# Patient Record
Sex: Male | Born: 2000 | Race: Black or African American | Hispanic: No | Marital: Single
Health system: Southern US, Community
[De-identification: ages and names within clinical notes are randomized; demographics above are authoritative.]

---

## 2020-07-19 ENCOUNTER — Other Ambulatory Visit: Payer: Self-pay

## 2020-07-19 ENCOUNTER — Emergency Department (HOSPITAL_COMMUNITY)
Admission: EM | Admit: 2020-07-19 | Discharge: 2020-07-20 | Discharge status: Against Medical Advice | Payer: Medicaid Other | Attending: Emergency Medicine | Admitting: Emergency Medicine

## 2020-07-19 ENCOUNTER — Emergency Department (HOSPITAL_COMMUNITY): Payer: Medicaid Other

## 2020-07-19 DIAGNOSIS — Z5321 Procedure and treatment not carried out due to patient leaving prior to being seen by health care provider: Secondary | ICD-10-CM | POA: Insufficient documentation

## 2020-07-19 DIAGNOSIS — R079 Chest pain, unspecified: Secondary | ICD-10-CM | POA: Insufficient documentation

## 2020-07-19 DIAGNOSIS — R0602 Shortness of breath: Secondary | ICD-10-CM | POA: Diagnosis not present

## 2020-07-19 LAB — CBC
HCT: 47.1 % (ref 39.0–52.0)
Hemoglobin: 16.4 g/dL (ref 13.0–17.0)
MCH: 30.1 pg (ref 26.0–34.0)
MCHC: 34.8 g/dL (ref 30.0–36.0)
MCV: 86.6 fL (ref 80.0–100.0)
Platelets: 236 10*3/uL (ref 150–400)
RBC: 5.44 MIL/uL (ref 4.22–5.81)
RDW: 11.1 % — ABNORMAL LOW (ref 11.5–15.5)
WBC: 6 10*3/uL (ref 4.0–10.5)
nRBC: 0 % (ref 0.0–0.2)

## 2020-07-19 NOTE — ED Triage Notes (Signed)
Patient arrives to ED POV due to CP. Patient states he began to feel his chest "spasm" causing  chest and back pain. No pain at this time. No cardiac Hx. Patient A/O x4

## 2020-07-19 NOTE — ED Provider Notes (Signed)
Emergency Medicine Provider Triage Evaluation Note  Dan Barrera , a 20 y.o. male  was evaluated in triage.  Pt complains of left sided chest pain and SOB that started yesterday.  No hx of the same.  Denies cough or fever.  Had been having some pain on the left ribs for a little over a week.  Denies any injury.  Review of Systems  Positive: Chest pain, SOB Negative: Fever, cough  Physical Exam  BP (!) 141/78 (BP Location: Left Arm)   Pulse 80   Temp 98.4 F (36.9 C) (Oral)   Resp 16   Ht 5' 11.75" (1.822 m)   Wt 102.1 kg   SpO2 100%   BMI 30.73 kg/m  Gen:   Awake, no distress   Resp:  Normal effort  MSK:   Moves extremities without difficulty  Other:    Medical Decision Making  Medically screening exam initiated at 10:33 PM.  Appropriate orders placed.  Dan Barrera was informed that the remainder of the evaluation will be completed by another provider, this initial triage assessment does not replace that evaluation, and the importance of remaining in the ED until their evaluation is complete.     Dan Horseman, PA-C 07/19/20 2234    Blane Ohara, MD 07/19/20 2308

## 2020-07-20 LAB — BASIC METABOLIC PANEL
Anion gap: 14 (ref 5–15)
BUN: 10 mg/dL (ref 6–20)
CO2: 25 mmol/L (ref 22–32)
Calcium: 10.2 mg/dL (ref 8.9–10.3)
Chloride: 100 mmol/L (ref 98–111)
Creatinine, Ser: 1.09 mg/dL (ref 0.61–1.24)
GFR, Estimated: >60 mL/min (ref >60)
Glucose, Bld: 80 mg/dL (ref 70–99)
Potassium: 3.7 mmol/L (ref 3.5–5.1)
Sodium: 139 mmol/L (ref 135–145)

## 2020-07-20 LAB — PROTIME-INR
INR: 1 (ref 0.8–1.2)
Prothrombin Time: 12.7 seconds (ref 11.4–15.2)

## 2020-07-20 LAB — D-DIMER, QUANTITATIVE: D-Dimer, Quant: <0.27 ug/mL-FEU (ref 0.00–0.50)

## 2020-07-20 LAB — TROPONIN I (HIGH SENSITIVITY)
Troponin I (High Sensitivity): <2 ng/L (ref <18)
Troponin I (High Sensitivity): 3 ng/L (ref <18)

## 2020-07-20 NOTE — ED Notes (Signed)
Patient left on own accord °

## 2022-05-29 IMAGING — CR DG CHEST 2V
2 series · 2 of 2 positions shown · non-contrast
Comparison: None.

CLINICAL DATA: Chest pain

EXAM:
CHEST - 2 VIEW

[chest pa]
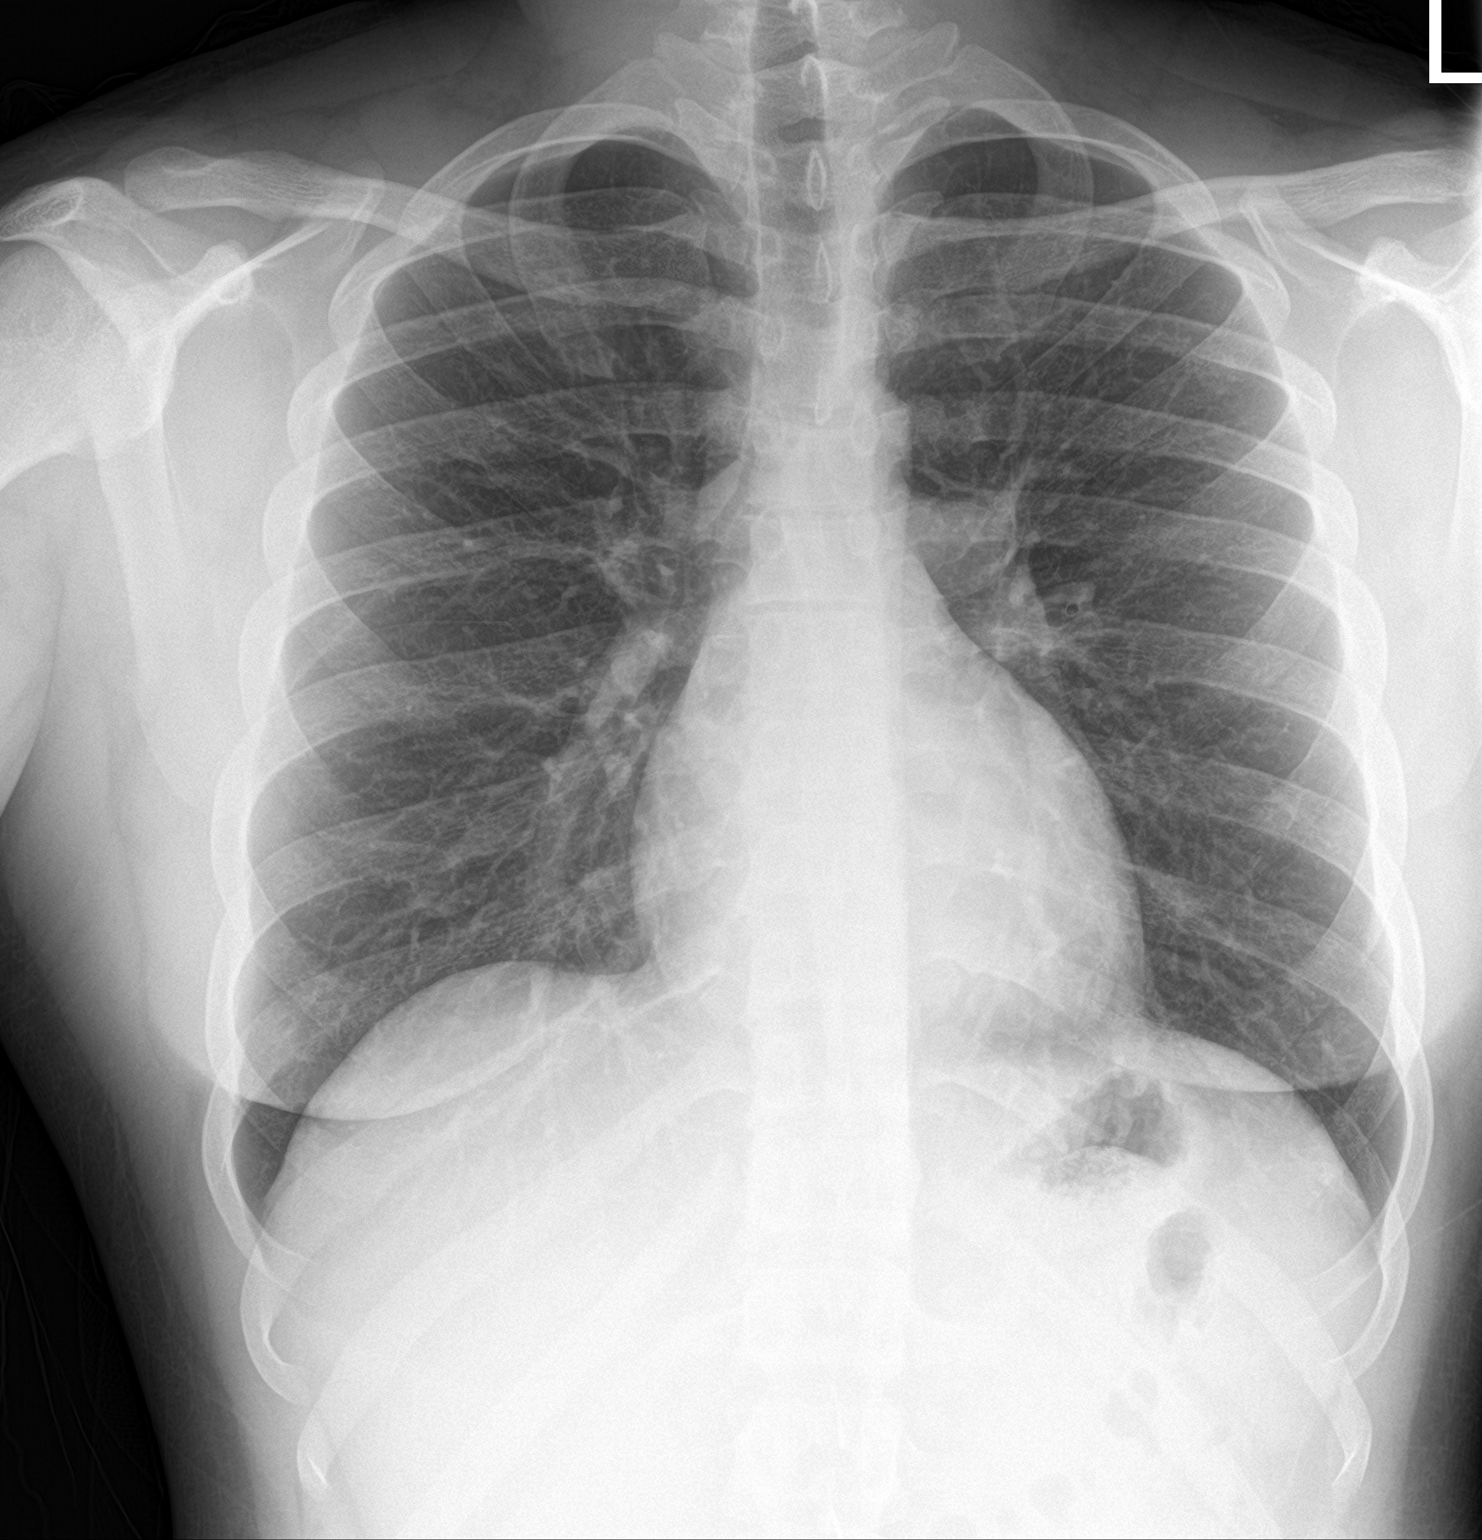

[chest lat]
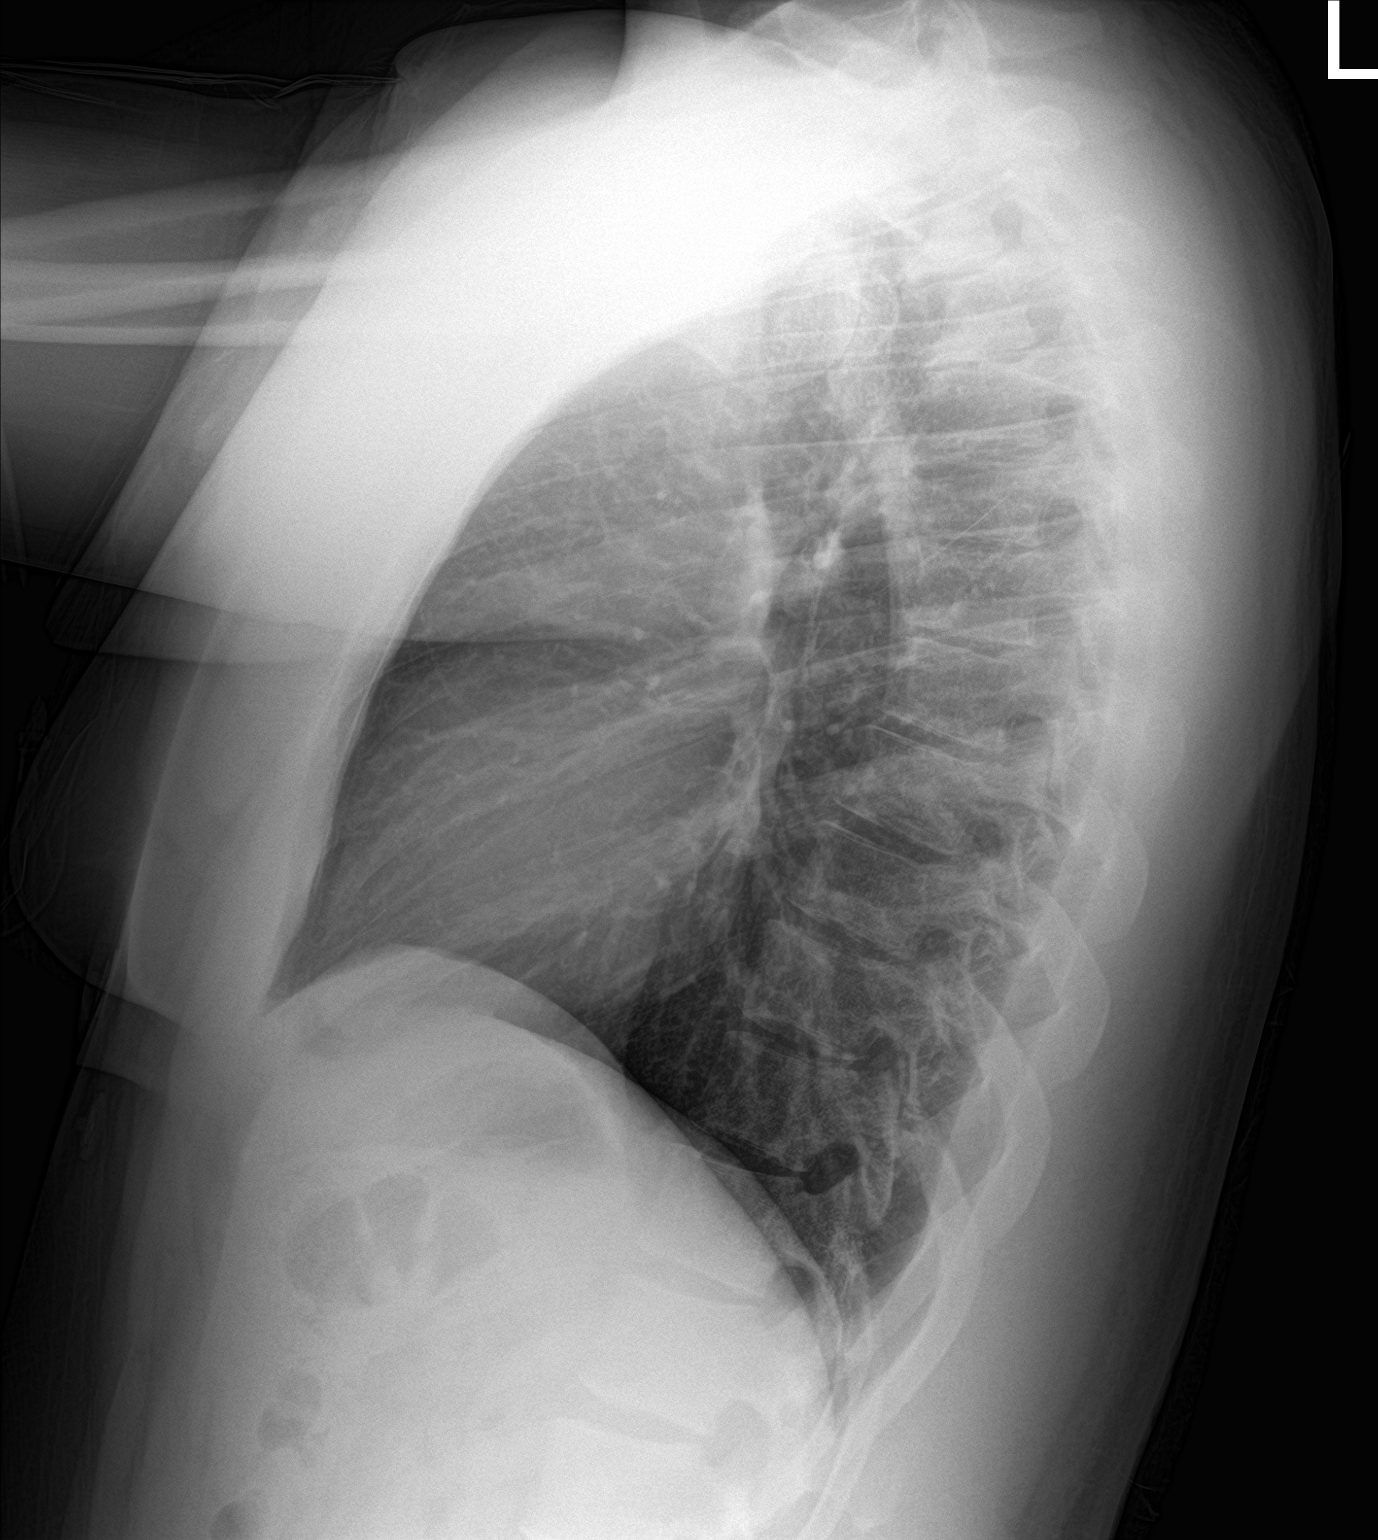

[2 of 2 positions shown; findings below may reference images not displayed]

FINDINGS: The heart size and mediastinal contours are within normal limits.
Both lungs are clear. The visualized skeletal structures are
unremarkable.
IMPRESSION: No active cardiopulmonary disease.
